# Patient Record
Sex: Male | Born: 1960 | Race: White | Hispanic: Yes | Marital: Married | Smoking: Former smoker
Health system: Southern US, Community
[De-identification: ages and names within clinical notes are randomized; demographics above are authoritative.]

## PROBLEM LIST (undated history)

## (undated) DIAGNOSIS — E7800 Pure hypercholesterolemia, unspecified: Secondary | ICD-10-CM

## (undated) DIAGNOSIS — E119 Type 2 diabetes mellitus without complications: Secondary | ICD-10-CM

---

## 2012-11-11 ENCOUNTER — Encounter (HOSPITAL_COMMUNITY): Payer: Self-pay | Admitting: *Deleted

## 2012-11-11 ENCOUNTER — Emergency Department (HOSPITAL_COMMUNITY): Payer: Managed Care, Other (non HMO)

## 2012-11-11 ENCOUNTER — Emergency Department (HOSPITAL_COMMUNITY)
Admission: EM | Admit: 2012-11-11 | Discharge: 2012-11-12 | Disposition: A | Discharge status: Home / Self Care | Payer: Managed Care, Other (non HMO) | Attending: Emergency Medicine | Admitting: Emergency Medicine

## 2012-11-11 DIAGNOSIS — R61 Generalized hyperhidrosis: Secondary | ICD-10-CM | POA: Insufficient documentation

## 2012-11-11 DIAGNOSIS — R000 Tachycardia, unspecified: Secondary | ICD-10-CM | POA: Insufficient documentation

## 2012-11-11 DIAGNOSIS — R0682 Tachypnea, not elsewhere classified: Secondary | ICD-10-CM | POA: Insufficient documentation

## 2012-11-11 DIAGNOSIS — E119 Type 2 diabetes mellitus without complications: Secondary | ICD-10-CM | POA: Insufficient documentation

## 2012-11-11 DIAGNOSIS — J189 Pneumonia, unspecified organism: Secondary | ICD-10-CM

## 2012-11-11 DIAGNOSIS — R509 Fever, unspecified: Secondary | ICD-10-CM | POA: Insufficient documentation

## 2012-11-11 DIAGNOSIS — R071 Chest pain on breathing: Secondary | ICD-10-CM | POA: Insufficient documentation

## 2012-11-11 DIAGNOSIS — E7800 Pure hypercholesterolemia, unspecified: Secondary | ICD-10-CM | POA: Insufficient documentation

## 2012-11-11 HISTORY — DX: Pure hypercholesterolemia, unspecified: E78.00

## 2012-11-11 HISTORY — DX: Type 2 diabetes mellitus without complications: E11.9

## 2012-11-11 LAB — BASIC METABOLIC PANEL
BUN: 9 mg/dL (ref 6–23)
CO2: 22 mEq/L (ref 19–32)
Calcium: 9.2 mg/dL (ref 8.4–10.5)
Chloride: 94 mEq/L — ABNORMAL LOW (ref 96–112)
Creatinine, Ser: 0.89 mg/dL (ref 0.50–1.35)
GFR calc Af Amer: >90 mL/min (ref >90)
GFR calc non Af Amer: >90 mL/min (ref >90)
Glucose, Bld: 324 mg/dL — ABNORMAL HIGH (ref 70–99)
Potassium: 3.8 mEq/L (ref 3.5–5.1)
Sodium: 130 mEq/L — ABNORMAL LOW (ref 135–145)

## 2012-11-11 LAB — CBC
HCT: 39.1 % (ref 39.0–52.0)
Hemoglobin: 14.1 g/dL (ref 13.0–17.0)
MCH: 31.3 pg (ref 26.0–34.0)
MCHC: 36.1 g/dL — ABNORMAL HIGH (ref 30.0–36.0)
MCV: 86.7 fL (ref 78.0–100.0)
Platelets: 170 10*3/uL (ref 150–400)
RBC: 4.51 MIL/uL (ref 4.22–5.81)
RDW: 12.5 % (ref 11.5–15.5)
WBC: 17.9 10*3/uL — ABNORMAL HIGH (ref 4.0–10.5)

## 2012-11-11 LAB — CG4 I-STAT (LACTIC ACID): Lactic Acid, Venous: 2.08 mmol/L (ref 0.5–2.2)

## 2012-11-11 MED ORDER — ACETAMINOPHEN 500 MG PO TABS
1000.0000 mg | ORAL_TABLET | Freq: Once | ORAL | Status: AC
  Administered 2012-11-11: 1000 mg via ORAL
  Filled 2012-11-11: qty 2

## 2012-11-11 MED ORDER — SODIUM CHLORIDE 0.9 % IV BOLUS (SEPSIS)
1000.0000 mL | Freq: Once | INTRAVENOUS | Status: AC
  Administered 2012-11-11: 1000 mL via INTRAVENOUS

## 2012-11-11 MED ORDER — AZITHROMYCIN 250 MG PO TABS: 250.0000 mg | ORAL_TABLET | Freq: Every day | ORAL | Status: AC

## 2012-11-11 MED ORDER — DEXTROSE 5 % IV SOLN
500.0000 mg | Freq: Once | INTRAVENOUS | Status: AC
  Administered 2012-11-11: 500 mg via INTRAVENOUS
  Filled 2012-11-11: qty 500

## 2012-11-11 MED ORDER — ACETAMINOPHEN 500 MG PO TABS: 500.0000 mg | ORAL_TABLET | Freq: Four times a day (QID) | ORAL | Status: AC | PRN

## 2012-11-11 MED ORDER — IBUPROFEN 400 MG PO TABS
600.0000 mg | ORAL_TABLET | Freq: Once | ORAL | Status: AC
  Administered 2012-11-11: 600 mg via ORAL
  Filled 2012-11-11: qty 1

## 2012-11-11 MED ORDER — DEXTROSE 5 % IV SOLN
1.0000 g | Freq: Once | INTRAVENOUS | Status: AC
  Administered 2012-11-11: 1 g via INTRAVENOUS
  Filled 2012-11-11: qty 10

## 2012-11-11 NOTE — ED Provider Notes (Signed)
History     CSN: [id number]  Arrival date & time 11/11/12  1039   First MD Initiated Contact with Patient 11/11/12 1111      Chief Complaint  Patient presents with  . Cough  . Shortness of Breath    (Consider location/radiation/quality/duration/timing/severity/associated sxs/prior treatment) HPI Pt is a 52yo male with hx of hyperlipidemia and DM presenting with acute onset of non-productive cough that started last night, associated with chills, subjective fever, sweats, and SOB.  Pt states he was feeling well up until last night, reports "coughing fit" and lower left sided rib pain that is sore and aching. Pain is worse with deep breathing and coughing. Has tried OTC cold medication w/o relief.  Also tried son's nebulizer w/o much relief.  Denies nausea or vomiting. Pt is not a smoker, no hx of asthma or COPD.  No known sick contacts.  Reports flight from [place] 2 weeks ago.    Past Medical History  Diagnosis Date  . High cholesterol   . Diabetes mellitus without complication     History reviewed. No pertinent past surgical history.  History reviewed. No pertinent family history.  History  Substance Use Topics  . Smoking status: Not on file  . Smokeless tobacco: Not on file  . Alcohol Use: No      Review of Systems  Constitutional: Positive for fever, chills and diaphoresis. Negative for fatigue.  Respiratory: Positive for cough and shortness of breath.   Cardiovascular: Positive for chest pain. Negative for palpitations and leg swelling.  Gastrointestinal: Negative for nausea and vomiting.  All other systems reviewed and are negative.    Allergies  Review of patient's allergies indicates no known allergies.  Home Medications   Current Outpatient Rx  Name  Route  Sig  Dispense  Refill  . acetaminophen (TYLENOL) 500 MG tablet   Oral   Take 1 tablet (500 mg total) by mouth every 6 (six) hours as needed for pain.   30 tablet   0   . azithromycin (ZITHROMAX) 250  MG tablet   Oral   Take 1 tablet (250 mg total) by mouth daily.   4 tablet   0     BP 107/84  Pulse 106  Temp(Src) 98.9 F (37.2 C) (Oral)  Resp 18  SpO2 98%  Physical Exam  Nursing note and vitals reviewed. Constitutional: He appears well-developed and well-nourished.  Pt lying in exam bed, breathing rapidly.  Able to speak in full sentences but becomes winded.   HENT:  Head: Normocephalic and atraumatic.  Eyes: Conjunctivae are normal. No scleral icterus.  Neck: Normal range of motion. Neck supple.  No nuchal rigidity or meningeal signs   Cardiovascular: Regular rhythm and normal heart sounds.  Tachycardia present.   Pulmonary/Chest: Tachypnea noted. No respiratory distress. He has no wheezes. He has rhonchi in the left lower field. He has no rales. He exhibits tenderness ( left lower anterior ribs ).  Abdominal: Soft. Bowel sounds are normal. He exhibits no distension and no mass. There is no tenderness. There is no rebound and no guarding.  Musculoskeletal: Normal range of motion. He exhibits no tenderness.  Neurological: He is alert.  Skin: Skin is warm and dry. He is not diaphoretic.    ED Course  Procedures (including critical care time)  Labs Reviewed  CBC - Abnormal; Notable for the following:    WBC 17.9 (*)    MCHC 36.1 (*)    All other components within normal limits  BASIC METABOLIC PANEL - Abnormal; Notable for the following:    Sodium 130 (*)    Chloride 94 (*)    Glucose, Bld 324 (*)    All other components within normal limits  CG4 I-STAT (LACTIC ACID)   Dg Chest 2 View  11/11/2012   *RADIOLOGY REPORT*  Clinical Data: Shortness of breath.  History of diabetes, hypertension, smoking.  CHEST - 2 VIEW  Comparison: None.  Findings: Heart size is normal.  There is prominence of interstitial markings, which may be chronic.  Within the left lower lobe, there is dense consolidation, consistent with infectious process.  Possible left pleural effusion.  No  pulmonary edema.  IMPRESSION: Left lower lobe infiltrate.   Original Report Authenticated By: Norva Pavlov, M.D.     Date: 11/11/2012  Rate: 116  Rhythm: sinus tachycardia  QRS Axis: normal  Intervals: normal  ST/T Wave abnormalities: nonspecific T wave changes  Conduction Disutrbances:none  Narrative Interpretation:   Old EKG Reviewed: none available    1. Left upper lobe pneumonia       MDM  Concern for pneumonia, URI, CAD, PE (low risk based on [place]).  Labs: CBC, BMP and CXR with EKG ordered.    EKG: unremarkable CBC: WBC-17.9 BMP: mild hyponatremia CXR: left lower lobe infiltrate, consistent with LLL pneumonia   Pt's is febrile, tachycardic, and tachypneic.  Started pt on fluids, acetaminophen, rocephin and azithromycin.  Will reevaluate pt to determine admittance to obs vs out patient tx w/ close f/u.  Pt's vitals have improved since receiving tx in ED.  Pt states he feels better and also appears better. Pt is no longer tachypneic while speaking.   Pt is fairly young, no significant comorbidities, and appears reliable.  Dr. Juleen China also spoke extensively with pt advising him of risks and benefits of observation vs discharging home.  Pt verbalized understanding of risks involved with outpatient treatment but agreed to return to ED if symptoms worsen.  Strict return precautions provided.  Rx: azithromycin.  F/u with PCP next week.  Vitals: unremarkable. Discharged home in stable condition.    Discussed pt with attending during ED encounter.        Junius Finner, PA-C 11/11/12 2021

## 2012-11-11 NOTE — ED Notes (Signed)
IV in R hand removed per d/c instructions. Catheter intact

## 2012-11-11 NOTE — ED Provider Notes (Signed)
2:01 PM  Medical screening examination/treatment/procedure(s) were conducted as a shared visit with non-physician practitioner(s) and myself.  I personally evaluated the patient during the encounter.  51yM with cough and SOB. Pleuritic CP. LLL pneumonia. Pt is not hypoxic, but is diaphoretic and tachypneic around 26-28 on my exam. He reports feeling better, but was on 2L O2 [address] when I examined him. I feel that he should be admitted for observation. Pt has 34 and 53 year old at home and partner that is non english speaking. He is sole financial support and would like to be treated as an outpt. I discussed with risks with him including but not limited to worsening symptoms, respiratory arrest, anoxic brain injury or death. He understands the benefit of being admitted. He has medical decision making capability. Will give dose rocephin, azithromycin and then ambulate off of oxygen afterwards. Pt strongly encouraged to stay if he feels significantly symptomatic.   Raeford Razor, MD 11/13/12 [phone]

## 2012-11-11 NOTE — ED Notes (Signed)
Pt states that he had non productive cough that started last night, feels like he cant take a deep breath. Had chills last night and has left rib pain when he breaths and coughs. spo2 96% at triage, speaking in full sentences.

## 2012-11-11 NOTE — ED Notes (Signed)
PA at bedside.

## 2012-11-12 NOTE — ED Provider Notes (Signed)
Medical screening examination/treatment/procedure(s) were conducted as a shared visit with non-physician practitioner(s) and myself.  I personally evaluated the patient during the encounter.  Please see completed note.   Raeford Razor, MD 11/12/12 1500

## 2012-12-20 ENCOUNTER — Other Ambulatory Visit: Payer: Self-pay | Admitting: Family Medicine

## 2012-12-20 ENCOUNTER — Ambulatory Visit
Admission: RE | Admit: 2012-12-20 | Discharge: 2012-12-20 | Disposition: A | Discharge status: Home / Self Care | Payer: 59 | Source: Ambulatory Visit | Attending: Family Medicine | Admitting: Family Medicine

## 2012-12-20 DIAGNOSIS — J189 Pneumonia, unspecified organism: Secondary | ICD-10-CM

## 2013-11-27 ENCOUNTER — Encounter: Payer: Self-pay | Admitting: Cardiology

## 2013-12-24 ENCOUNTER — Ambulatory Visit: Payer: 59 | Admitting: Cardiology

## 2013-12-30 ENCOUNTER — Telehealth: Payer: Self-pay | Admitting: *Deleted

## 2013-12-30 NOTE — Telephone Encounter (Signed)
Pharmacy calling to have rx refilled for patient. Medications are not in Epic nor is there a patient visit.  Will forward to nurse.

## 2013-12-31 NOTE — Telephone Encounter (Signed)
Pt cancelled his appt for 7/21.[place]pt was a new pt seen by Dr.Skains 12/24/12. No cardiac meds listed

## 2014-01-03 NOTE — Telephone Encounter (Signed)
Patient called again and stated that he needs atorvastatin refill. I made him aware of what was documented about him cancelling his appt, but he insisted that I ask you again anyway. He would like a call back to discuss. He can be reached at [phone]. Thanks, MI

## 2014-01-07 MED ORDER — ATORVASTATIN CALCIUM 20 MG PO TABS: 20.0000 mg | ORAL_TABLET | Freq: Every day | ORAL | Status: AC

## 2014-01-07 NOTE — Telephone Encounter (Signed)
Lm to cb.

## 2014-01-07 NOTE — Telephone Encounter (Signed)
Please call meds into [facility] [phone] Any questions please cal patient.

## 2014-01-07 NOTE — Telephone Encounter (Signed)
To Dr Anne FuSkains and Avie ArenasPam Fleming. Please advise.

## 2014-01-14 NOTE — Telephone Encounter (Signed)
OK to refill atorvastatin for one month.  Donato SchultzSKAINS, MARK, MD

## 2014-04-21 ENCOUNTER — Other Ambulatory Visit: Payer: Self-pay | Admitting: Cardiology

## 2014-04-23 ENCOUNTER — Other Ambulatory Visit: Payer: Self-pay

## 2014-05-09 IMAGING — CR DG CHEST 2V
2 series · 2 of 2 positions shown · non-contrast
Comparison: Two-view chest x-ray 11/11/2012.

CLINICAL DATA: Follow up pneumonia identified on prior chest
imaging.

CHEST - 2 VIEW

[w chest pa]
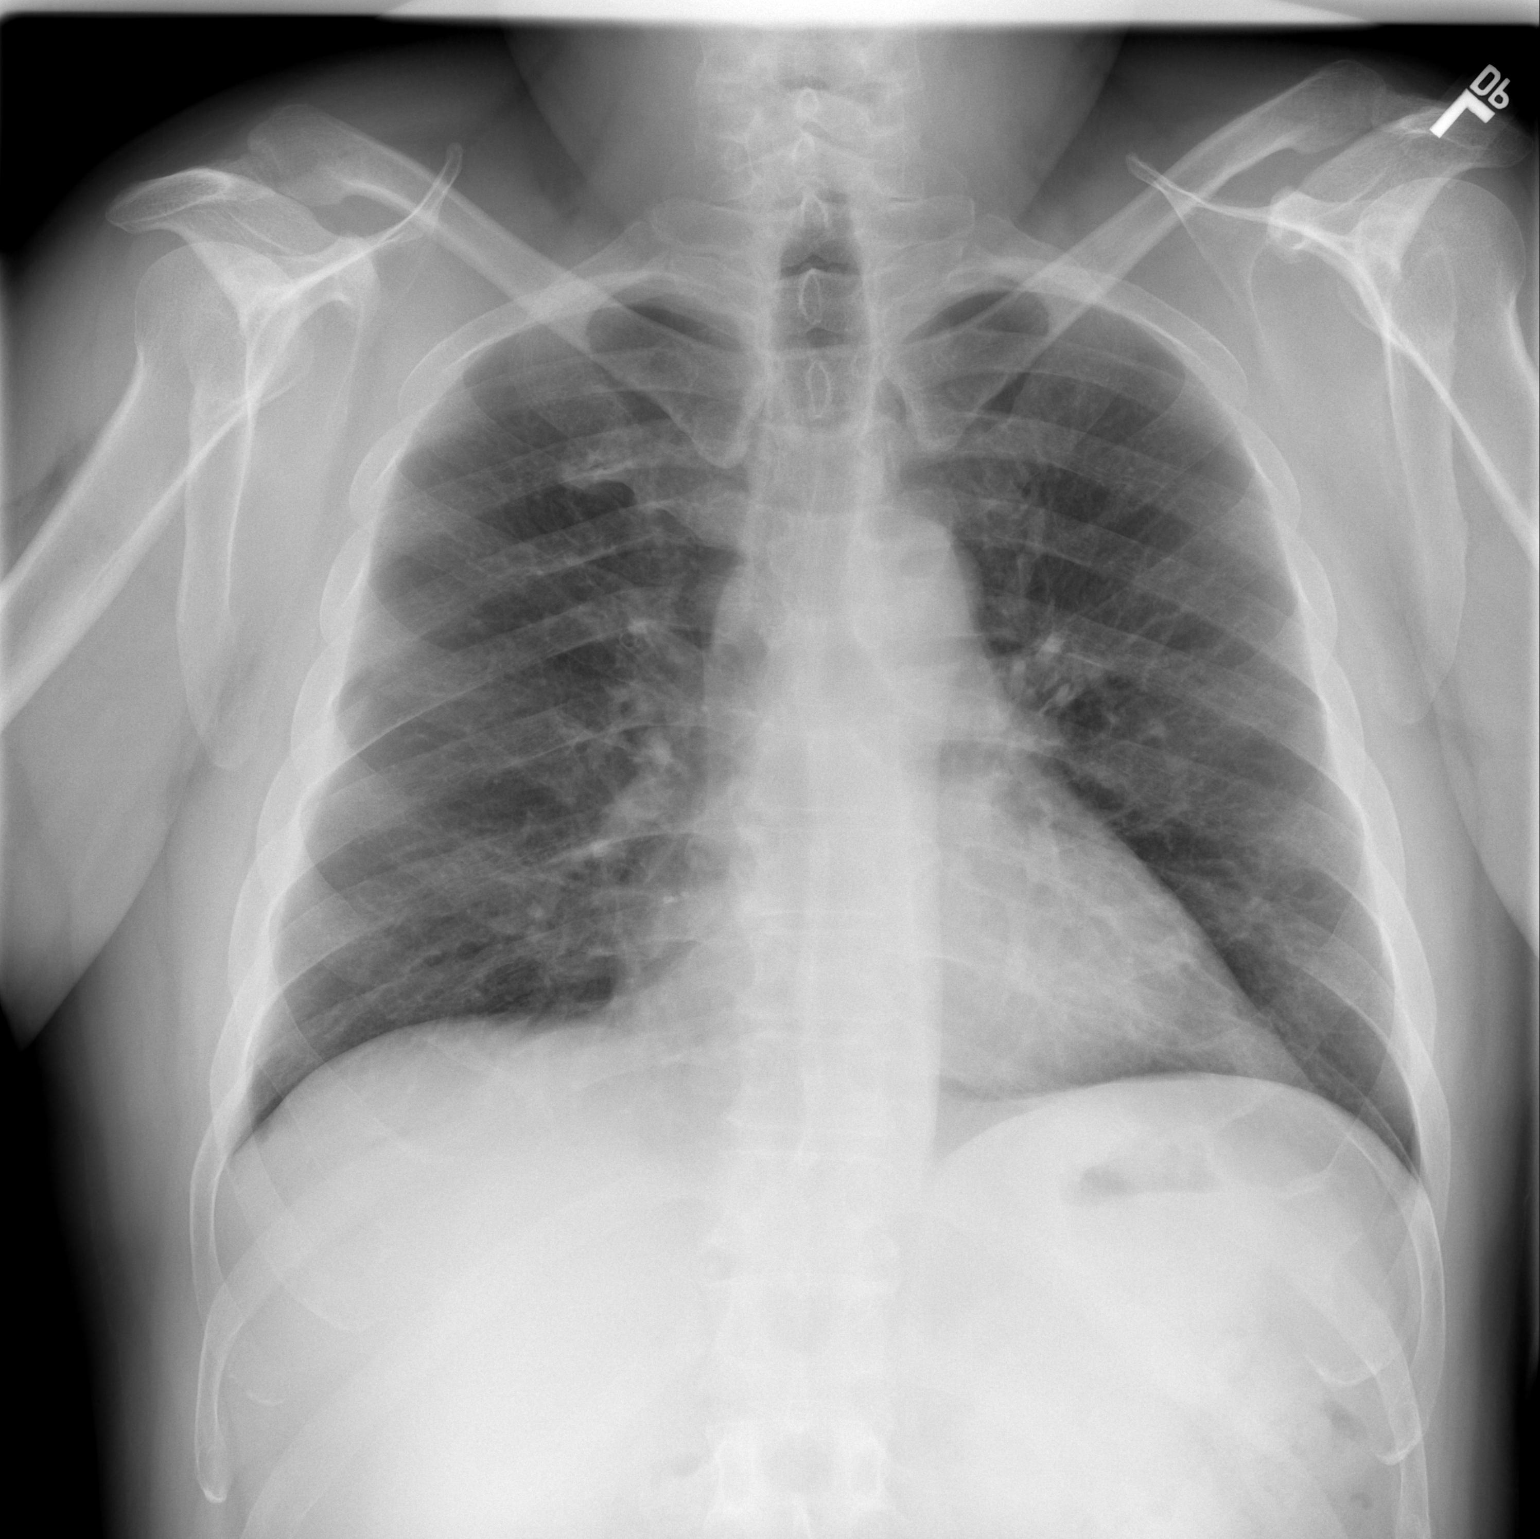

[w chest lat]
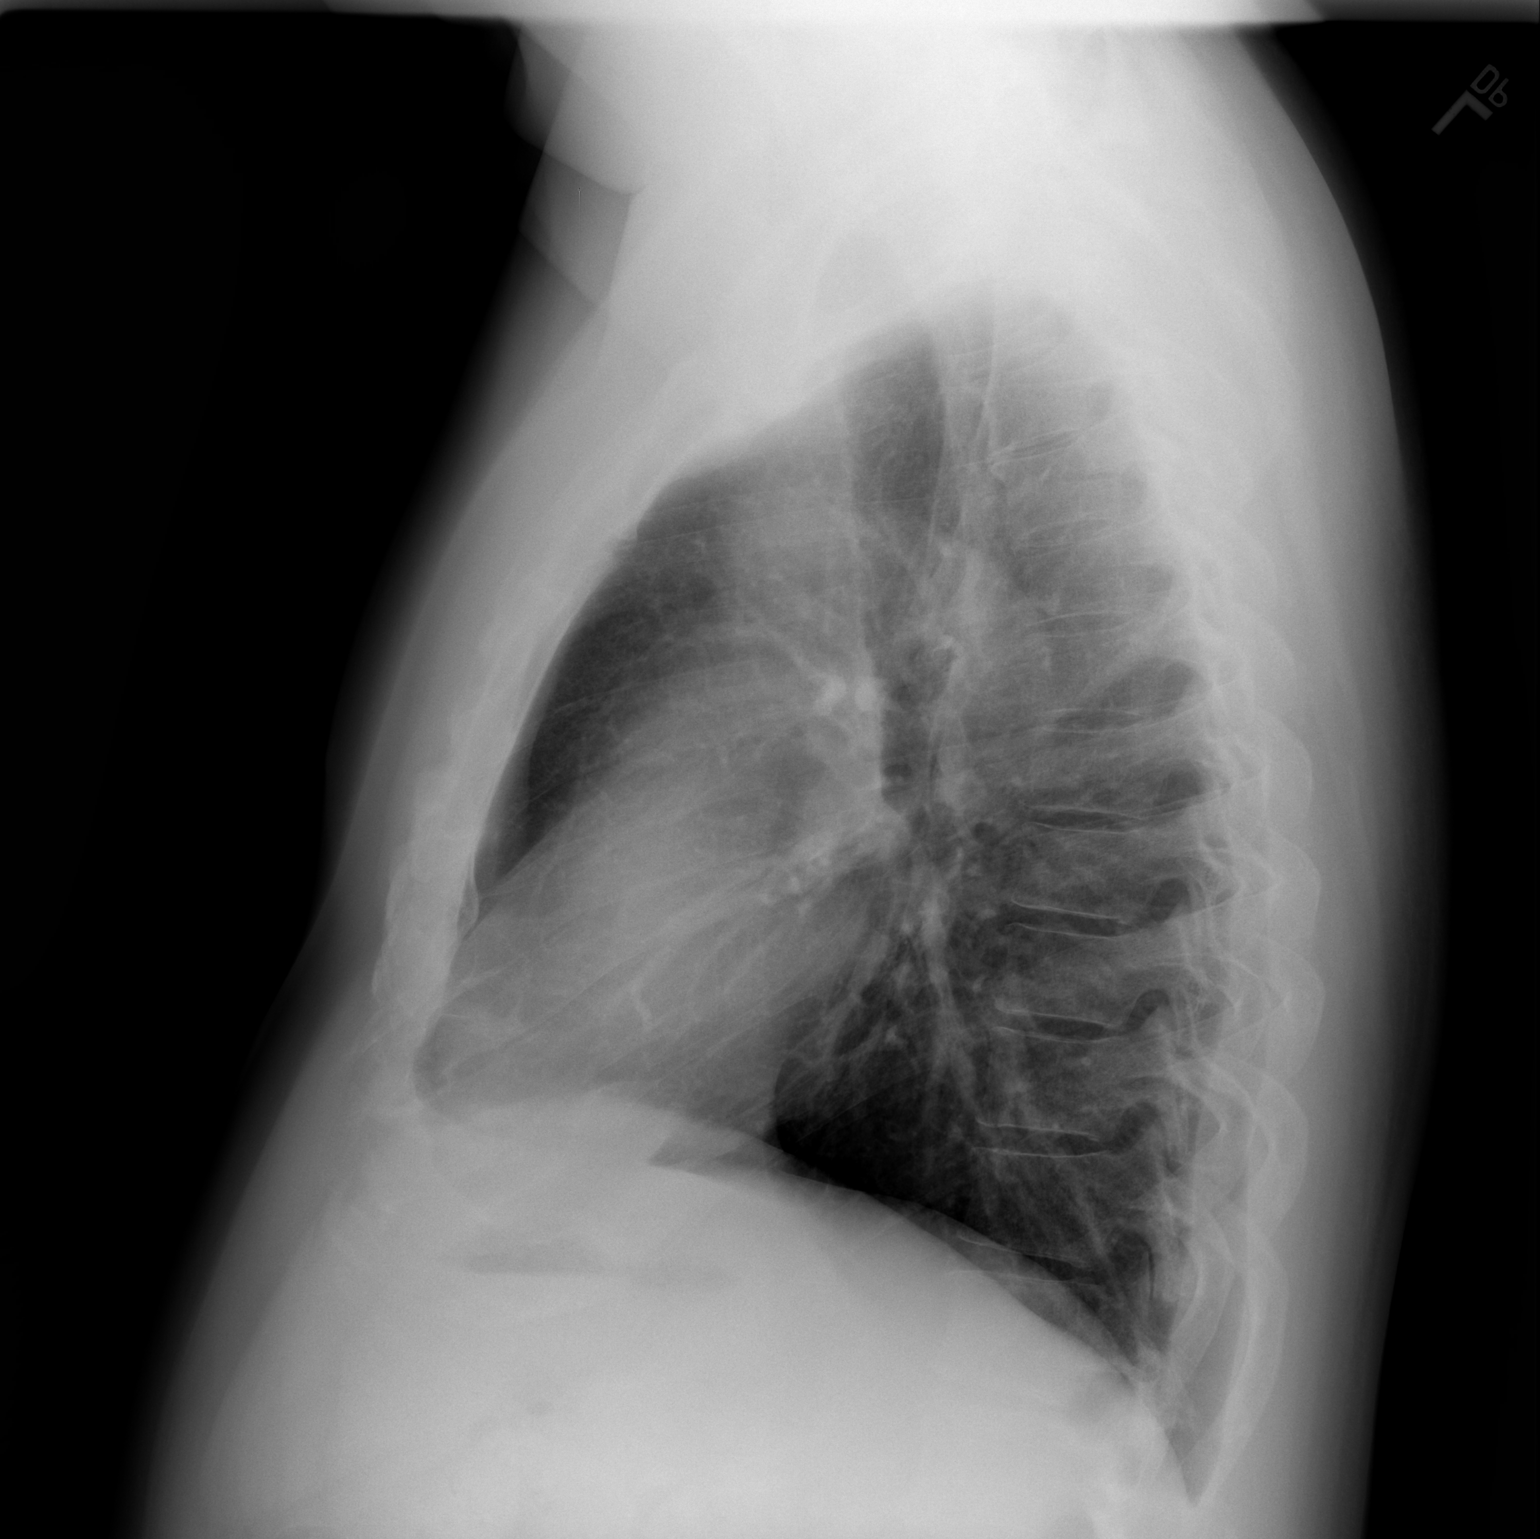

[2 of 2 positions shown; findings below may reference images not displayed]

FINDINGS: Interval resolution of the airspace opacities in the left
lower lobe.  No visible residual scarring.  Lungs now clear.
Bronchovascular markings normal.  Pulmonary vascularity normal.  No
pneumothorax.  No pleural effusions.  Cardiomediastinal silhouette
unremarkable, unchanged.
IMPRESSION: Resolution of left lower lobe pneumonia.  No acute cardiopulmonary
disease currently.
